# Patient Record
Sex: Female | Born: 1960 | Race: White | Hispanic: No | Smoking: Current some day smoker
Health system: Southern US, Community
[De-identification: ages and names within clinical notes are randomized; demographics above are authoritative.]

## PROBLEM LIST (undated history)

## (undated) DIAGNOSIS — N83209 Unspecified ovarian cyst, unspecified side: Secondary | ICD-10-CM

## (undated) DIAGNOSIS — I10 Essential (primary) hypertension: Secondary | ICD-10-CM

---

## 2005-09-25 ENCOUNTER — Emergency Department (HOSPITAL_COMMUNITY): Admission: EM | Admit: 2005-09-25 | Discharge: 2005-09-25 | Payer: Self-pay | Admitting: Family Medicine

## 2005-10-24 ENCOUNTER — Emergency Department (HOSPITAL_COMMUNITY): Admission: EM | Admit: 2005-10-24 | Discharge: 2005-10-24 | Payer: Self-pay | Admitting: Emergency Medicine

## 2005-11-17 ENCOUNTER — Encounter: Admission: RE | Admit: 2005-11-17 | Discharge: 2005-11-17 | Payer: Self-pay | Admitting: Internal Medicine

## 2005-12-19 ENCOUNTER — Emergency Department (HOSPITAL_COMMUNITY): Admission: EM | Admit: 2005-12-19 | Discharge: 2005-12-19 | Payer: Self-pay | Admitting: Family Medicine

## 2006-08-05 ENCOUNTER — Emergency Department (HOSPITAL_COMMUNITY): Admission: EM | Admit: 2006-08-05 | Discharge: 2006-08-05 | Payer: Self-pay | Admitting: Family Medicine

## 2006-08-30 ENCOUNTER — Emergency Department (HOSPITAL_COMMUNITY): Admission: EM | Admit: 2006-08-30 | Discharge: 2006-08-30 | Payer: Self-pay | Admitting: Emergency Medicine

## 2006-12-19 ENCOUNTER — Emergency Department (HOSPITAL_COMMUNITY): Admission: EM | Admit: 2006-12-19 | Discharge: 2006-12-19 | Payer: Self-pay | Admitting: Emergency Medicine

## 2009-04-24 ENCOUNTER — Emergency Department (HOSPITAL_COMMUNITY): Admission: EM | Admit: 2009-04-24 | Discharge: 2009-04-24 | Payer: Self-pay | Admitting: Emergency Medicine

## 2009-06-03 ENCOUNTER — Ambulatory Visit (HOSPITAL_COMMUNITY): Admission: RE | Admit: 2009-06-03 | Discharge: 2009-06-03 | Payer: Self-pay | Admitting: Obstetrics & Gynecology

## 2009-08-01 ENCOUNTER — Emergency Department (HOSPITAL_COMMUNITY): Admission: EM | Admit: 2009-08-01 | Discharge: 2009-08-01 | Payer: Self-pay | Admitting: Emergency Medicine

## 2009-11-06 ENCOUNTER — Encounter: Admission: RE | Admit: 2009-11-06 | Discharge: 2009-11-06 | Payer: Self-pay | Admitting: Nephrology

## 2009-11-21 ENCOUNTER — Encounter: Admission: RE | Admit: 2009-11-21 | Discharge: 2009-11-21 | Payer: Self-pay | Admitting: Neurosurgery

## 2010-03-30 ENCOUNTER — Encounter: Payer: Self-pay | Admitting: Neurosurgery

## 2010-05-26 LAB — GC/CHLAMYDIA PROBE AMP, GENITAL: Chlamydia, DNA Probe: NEGATIVE

## 2010-05-26 LAB — URINALYSIS, ROUTINE W REFLEX MICROSCOPIC
Bilirubin Urine: NEGATIVE
Glucose, UA: NEGATIVE mg/dL
Hgb urine dipstick: NEGATIVE
Protein, ur: NEGATIVE mg/dL
Specific Gravity, Urine: 1.026 (ref 1.005–1.030)
Urobilinogen, UA: 0.2 mg/dL (ref 0.0–1.0)

## 2010-05-26 LAB — CBC
HCT: 39.3 % (ref 36.0–46.0)
Hemoglobin: 13.5 g/dL (ref 12.0–15.0)
MCV: 87.8 fL (ref 78.0–100.0)
Platelets: 212 10*3/uL (ref 150–400)
RDW: 13 % (ref 11.5–15.5)
WBC: 10.1 10*3/uL (ref 4.0–10.5)

## 2010-05-26 LAB — DIFFERENTIAL
Basophils Relative: 1 % (ref 0–1)
Eosinophils Absolute: 0.1 10*3/uL (ref 0.0–0.7)
Lymphocytes Relative: 29 % (ref 12–46)
Lymphs Abs: 3 10*3/uL (ref 0.7–4.0)
Monocytes Absolute: 0.5 10*3/uL (ref 0.1–1.0)

## 2010-05-26 LAB — WET PREP, GENITAL: Trich, Wet Prep: NONE SEEN

## 2010-05-29 LAB — CBC
HCT: 38.7 % (ref 36.0–46.0)
Hemoglobin: 13.3 g/dL (ref 12.0–15.0)
MCHC: 34.3 g/dL (ref 30.0–36.0)
MCV: 88.8 fL (ref 78.0–100.0)
Platelets: 206 10*3/uL (ref 150–400)
RBC: 4.36 MIL/uL (ref 3.87–5.11)
RDW: 13.5 % (ref 11.5–15.5)

## 2010-05-29 LAB — BASIC METABOLIC PANEL
CO2: 29 mEq/L (ref 19–32)
Chloride: 103 mEq/L (ref 96–112)
GFR calc Af Amer: >60 mL/min (ref >60)
Glucose, Bld: 123 mg/dL — ABNORMAL HIGH (ref 70–99)
Potassium: 4 mEq/L (ref 3.5–5.1)
Sodium: 139 mEq/L (ref 135–145)

## 2010-05-29 LAB — DIFFERENTIAL
Basophils Absolute: 0.1 10*3/uL (ref 0.0–0.1)
Basophils Relative: 1 % (ref 0–1)
Eosinophils Absolute: 0.1 10*3/uL (ref 0.0–0.7)
Eosinophils Relative: 1 % (ref 0–5)
Lymphs Abs: 2.4 10*3/uL (ref 0.7–4.0)
Neutro Abs: 7.7 10*3/uL (ref 1.7–7.7)

## 2010-05-29 LAB — URINALYSIS, ROUTINE W REFLEX MICROSCOPIC
Glucose, UA: NEGATIVE mg/dL
Ketones, ur: NEGATIVE mg/dL
Protein, ur: NEGATIVE mg/dL
Urobilinogen, UA: 0.2 mg/dL (ref 0.0–1.0)

## 2010-05-29 LAB — POCT PREGNANCY, URINE: Preg Test, Ur: NEGATIVE

## 2010-06-06 ENCOUNTER — Emergency Department (HOSPITAL_COMMUNITY)
Admission: EM | Admit: 2010-06-06 | Discharge: 2010-06-06 | Disposition: A | Discharge status: Home / Self Care | Payer: Medicaid Other | Attending: Emergency Medicine | Admitting: Emergency Medicine

## 2010-06-06 DIAGNOSIS — J45909 Unspecified asthma, uncomplicated: Secondary | ICD-10-CM | POA: Insufficient documentation

## 2010-06-06 DIAGNOSIS — R51 Headache: Secondary | ICD-10-CM | POA: Insufficient documentation

## 2010-06-06 DIAGNOSIS — E119 Type 2 diabetes mellitus without complications: Secondary | ICD-10-CM | POA: Insufficient documentation

## 2010-06-06 DIAGNOSIS — I10 Essential (primary) hypertension: Secondary | ICD-10-CM | POA: Insufficient documentation

## 2010-06-06 DIAGNOSIS — Z79899 Other long term (current) drug therapy: Secondary | ICD-10-CM | POA: Insufficient documentation

## 2010-06-06 DIAGNOSIS — G8929 Other chronic pain: Secondary | ICD-10-CM | POA: Insufficient documentation

## 2010-06-06 DIAGNOSIS — M542 Cervicalgia: Secondary | ICD-10-CM | POA: Insufficient documentation

## 2010-07-31 ENCOUNTER — Other Ambulatory Visit: Payer: Self-pay | Admitting: Nephrology

## 2010-07-31 DIAGNOSIS — Z1231 Encounter for screening mammogram for malignant neoplasm of breast: Secondary | ICD-10-CM

## 2010-08-07 ENCOUNTER — Other Ambulatory Visit: Payer: Self-pay | Admitting: Nephrology

## 2010-08-07 DIAGNOSIS — IMO0002 Reserved for concepts with insufficient information to code with codable children: Secondary | ICD-10-CM

## 2010-08-07 DIAGNOSIS — M479 Spondylosis, unspecified: Secondary | ICD-10-CM

## 2010-08-07 DIAGNOSIS — M549 Dorsalgia, unspecified: Secondary | ICD-10-CM

## 2010-08-08 ENCOUNTER — Ambulatory Visit
Admission: RE | Admit: 2010-08-08 | Discharge: 2010-08-08 | Disposition: A | Discharge status: Home / Self Care | Payer: Medicaid Other | Source: Ambulatory Visit | Attending: Nephrology | Admitting: Nephrology

## 2010-08-08 DIAGNOSIS — Z1231 Encounter for screening mammogram for malignant neoplasm of breast: Secondary | ICD-10-CM

## 2010-08-19 ENCOUNTER — Other Ambulatory Visit: Payer: Medicaid Other

## 2010-09-01 ENCOUNTER — Other Ambulatory Visit (HOSPITAL_COMMUNITY): Payer: Medicaid Other

## 2010-09-11 ENCOUNTER — Other Ambulatory Visit (HOSPITAL_COMMUNITY): Payer: Medicaid Other

## 2010-09-12 ENCOUNTER — Other Ambulatory Visit (HOSPITAL_COMMUNITY): Payer: Medicaid Other

## 2010-09-12 ENCOUNTER — Inpatient Hospital Stay (HOSPITAL_COMMUNITY): Admission: RE | Admit: 2010-09-12 | Payer: Medicaid Other | Source: Ambulatory Visit

## 2011-02-13 IMAGING — US US PELVIS COMPLETE MODIFY
1 series · 13 of 25 positions shown · non-contrast
Comparison: CT on 04/24/2009

CLINICAL DATA: Abdominal pain.  Right adnexal cystic mass seen on
recent CT. Post menopausal female.

TRANSABDOMINAL AND TRANSVAGINAL ULTRASOUND OF PELVIS
TECHNIQUE: Both transabdominal and transvaginal ultrasound
examinations of the pelvis were performed including evaluation of
the uterus, ovaries, adnexal regions, and pelvic cul-de-sac.

[Series 1: us transvaginal non-ob · 13 of 57 slices shown]
[im 1/57]
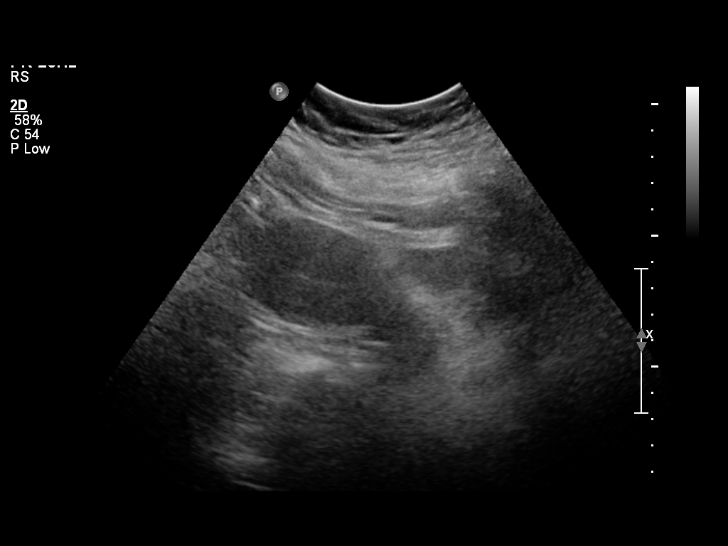
[im 5/57]
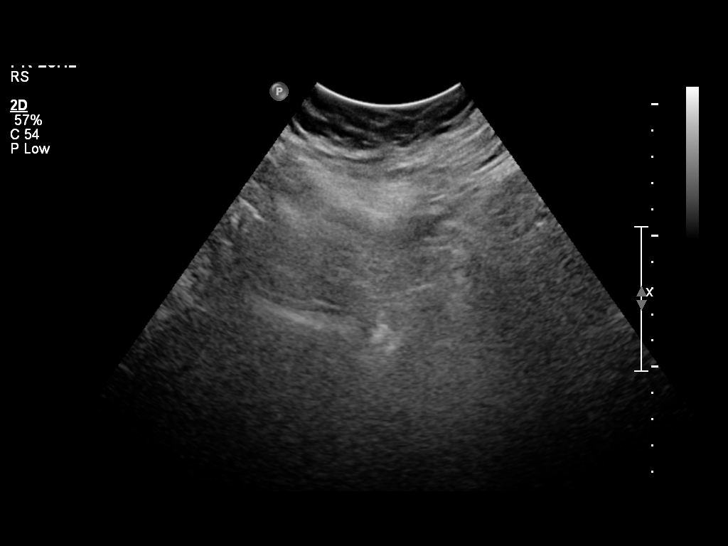
[im 10/57]
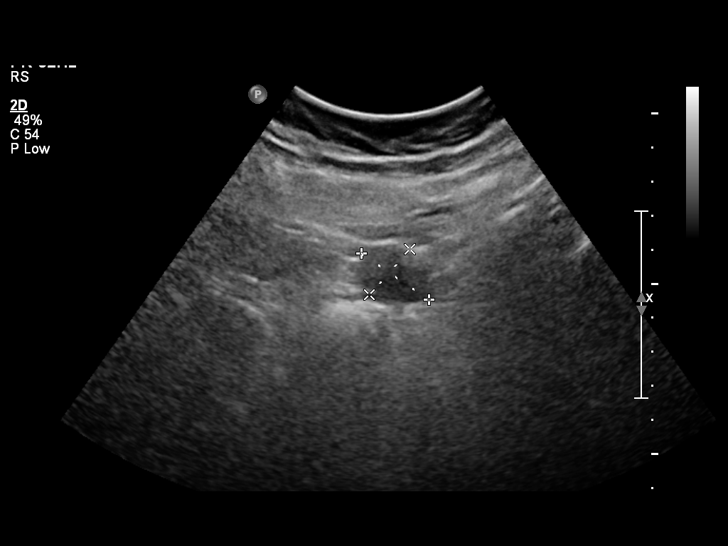
[im 15/57]
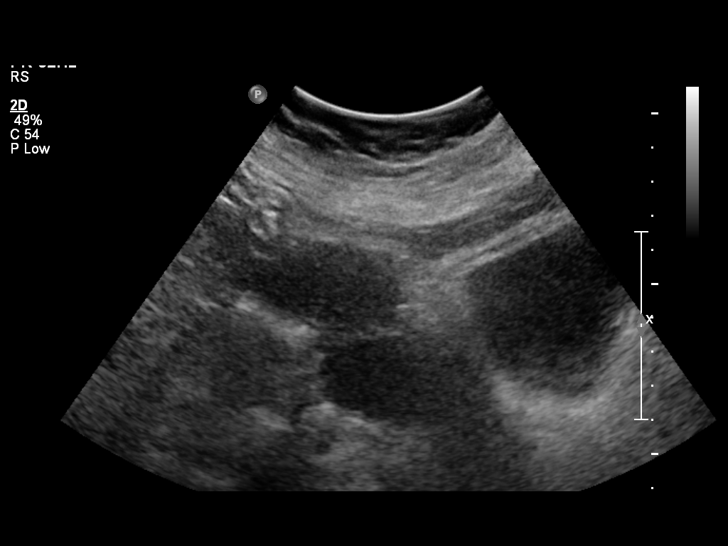
[im 19/57]
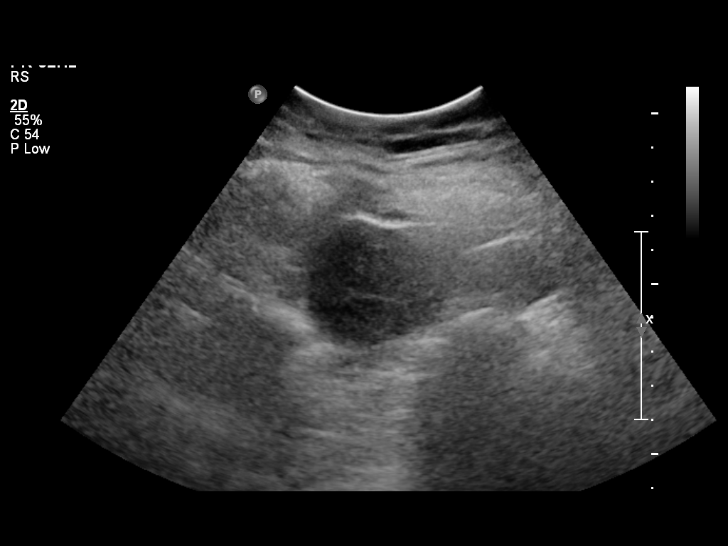
[im 24/57]
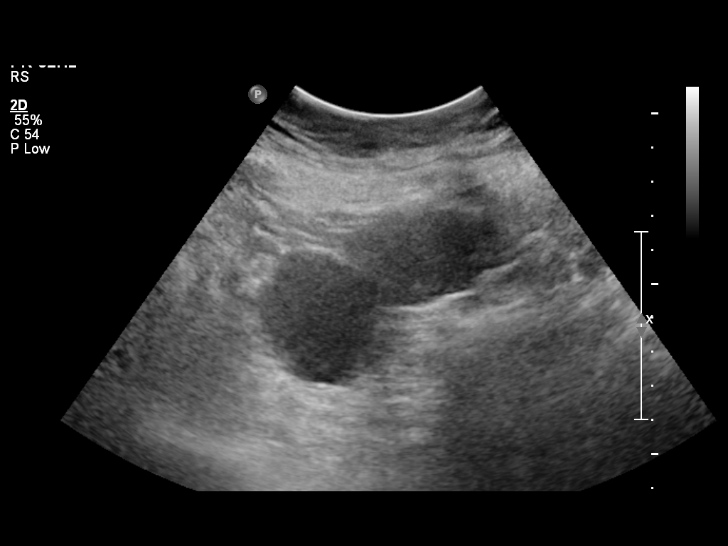
[im 29/57]
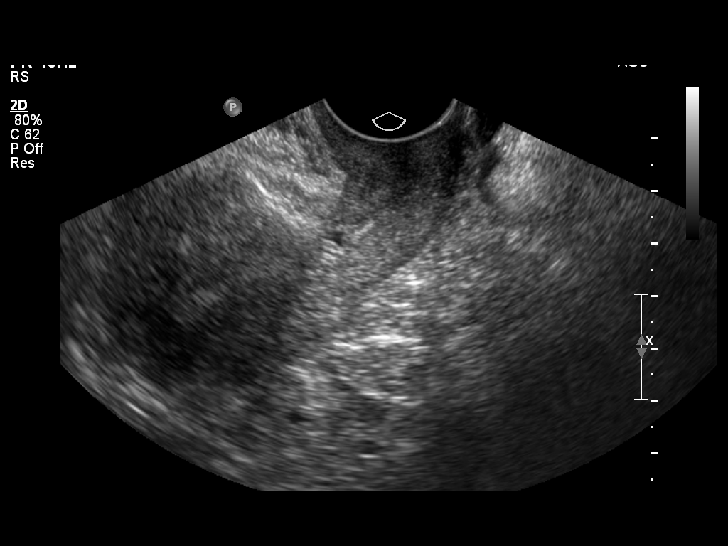
[im 33/57]
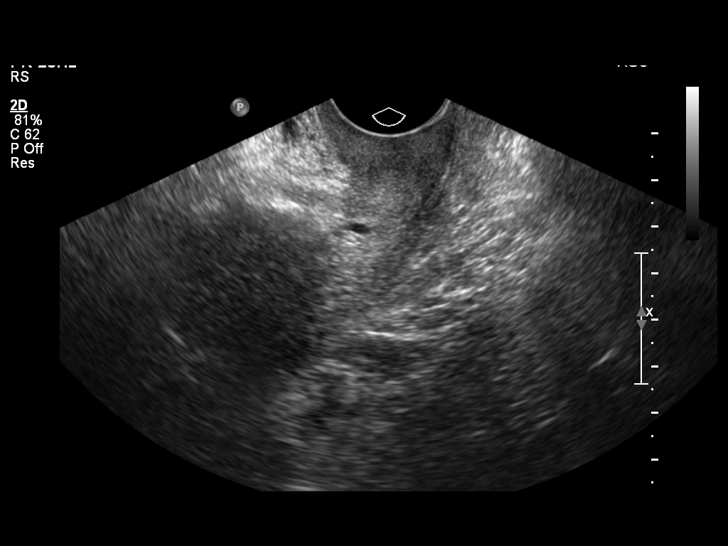
[im 38/57]
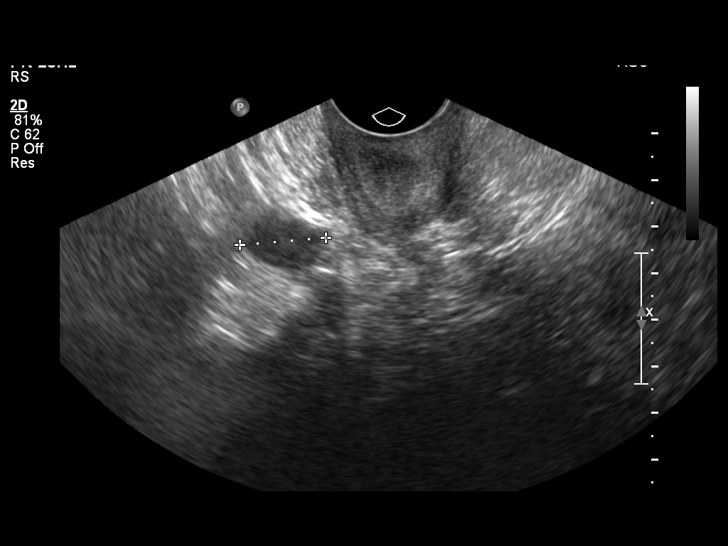
[im 43/57]
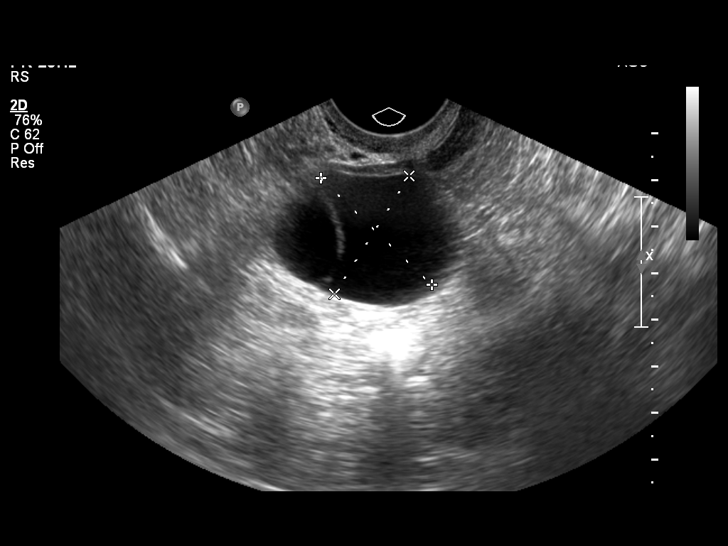
[im 47/57]
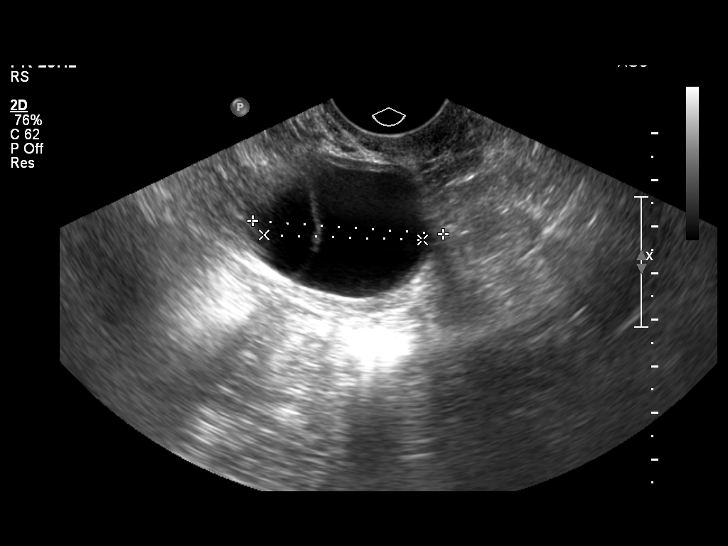
[im 52/57]
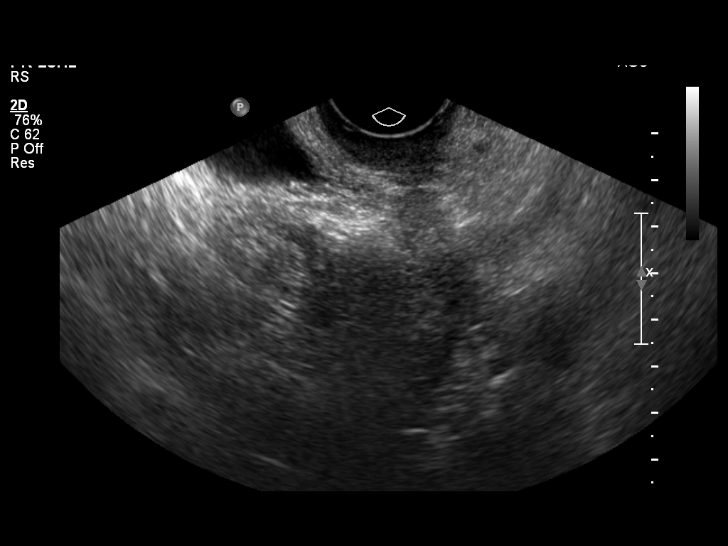
[im 57/57]
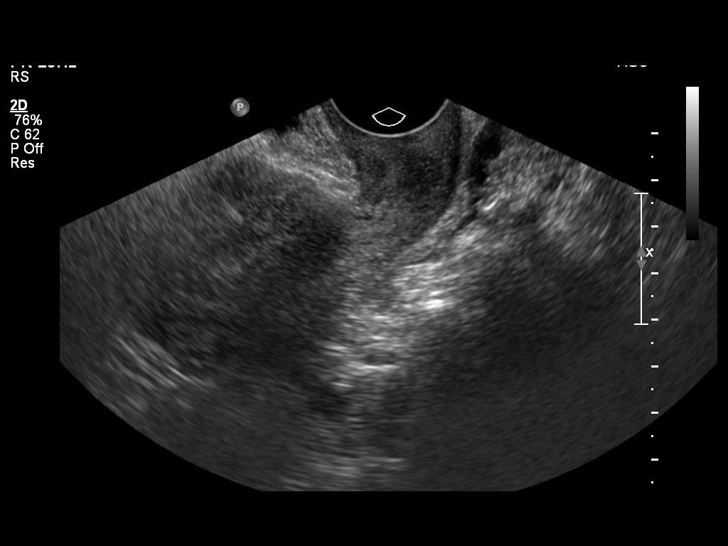

[13 of 25 positions shown; findings below may reference images not displayed]

FINDINGS: Uterus measures 8.7 x 4.0 x 5.0 cm. No fibroids or other uterine
masses identified.

Endometrium measures 5 mm in thickness.  Within normal limits in
appearance.

Right Ovary measures 4.1 x 3.5 x 4.1 cm. A cystic lesion is seen
containing a thin internal septation, but no evidence of soft
tissue nodularity or definite internal blood flow.  This lesion
measures 3.3 by 3.0 x 3.4 cm, which is a slightly smaller in size
than on previous CT when it measured 4.5 cm.

Left Ovary measures 2.1 x 1.4 x 1.9 cm.  Normal appearance.

Other Findings:  No adnexal mass or free fluid identified.
IMPRESSION: 1.  3.4 cm right ovarian cyst lesion with indeterminate, but
probably benign characteristics.  Annual follow-up by ultrasound is
recommended in a postmenopausal female.
2.  Normal uterus and left ovary.

This recommendation follows the consensus statement:  Management of
Asymptomatic Ovarian and Other Adnexal Cysts Imaged at US:  Society
of Radiologists in Ultrasound Consensus Conference Statement.
[URL]

## 2011-02-18 ENCOUNTER — Ambulatory Visit
Admission: RE | Admit: 2011-02-18 | Discharge: 2011-02-18 | Disposition: A | Discharge status: Home / Self Care | Payer: Medicaid Other | Source: Ambulatory Visit | Attending: Nephrology | Admitting: Nephrology

## 2011-02-18 ENCOUNTER — Other Ambulatory Visit: Payer: Self-pay | Admitting: Nephrology

## 2011-02-18 DIAGNOSIS — M25569 Pain in unspecified knee: Secondary | ICD-10-CM

## 2011-04-13 IMAGING — US US ART/VEN ABD/PELV/SCROTUM DOPPLER COMPLETE
1 series · 7 of 7 positions shown · non-contrast
Comparison: Ultrasound of the pelvis 08/01/2009 and 06/03/2009.

CLINICAL DATA: Right ovarian cyst.  Pelvic pain.

DOPPLER ULTRASOUND OF OVARIES
TECHNIQUE: Color and duplex Doppler ultrasound was utilized to
evaluate blood flow to the ovaries.

[Series 1: us art/ven abd/pelv/scrotum doppler complete · 0.13mm/px · 7 of 7 slices shown]
[im 1/7]
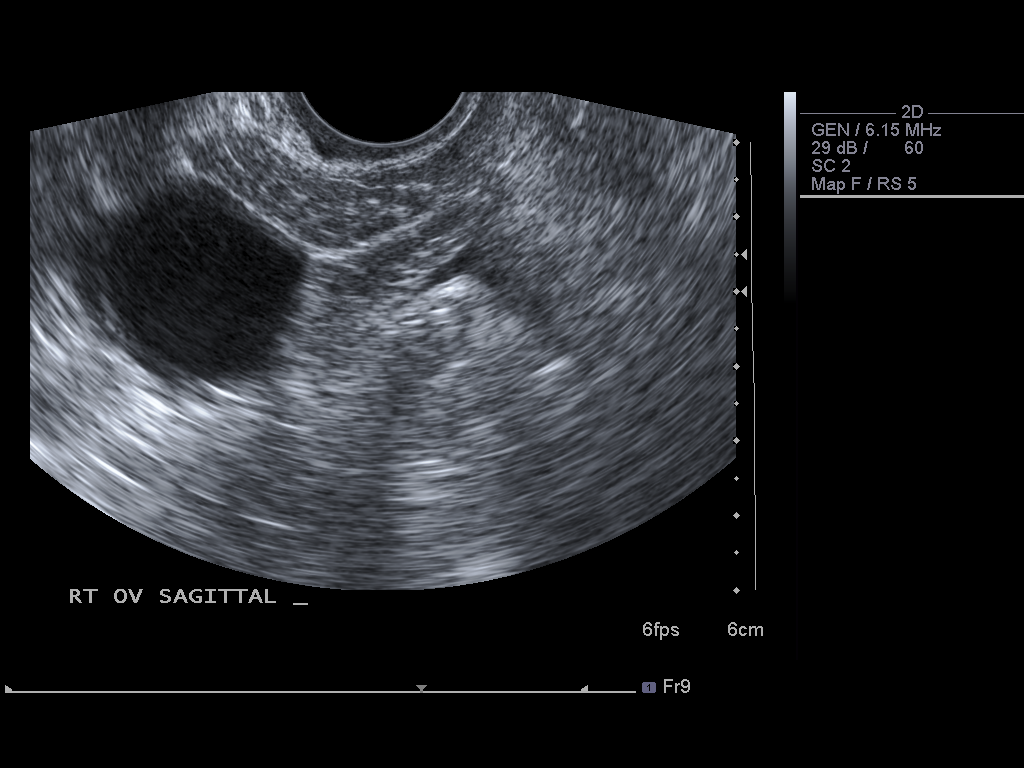
[im 2/7]
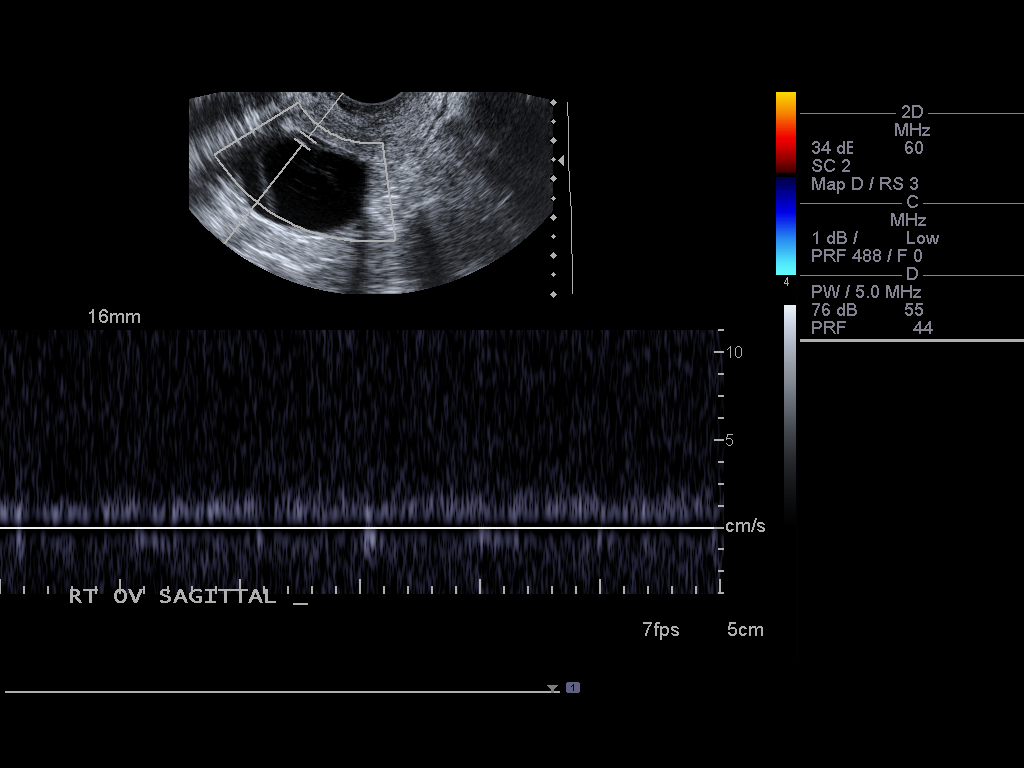
[im 3/7]
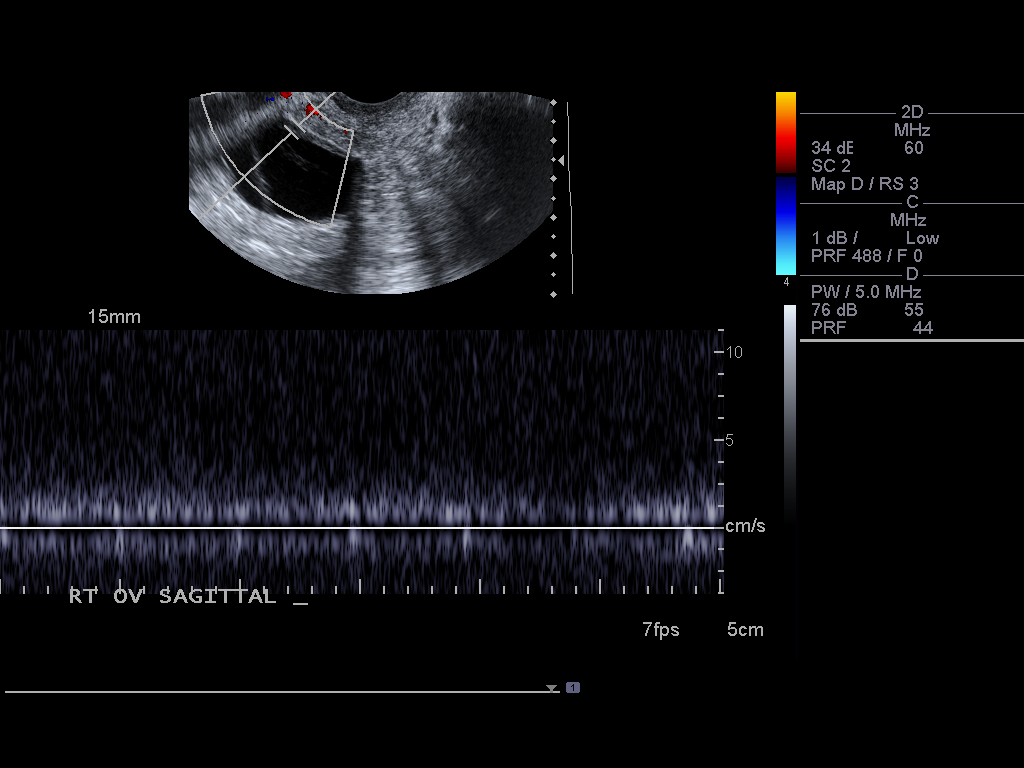
[im 4/7]
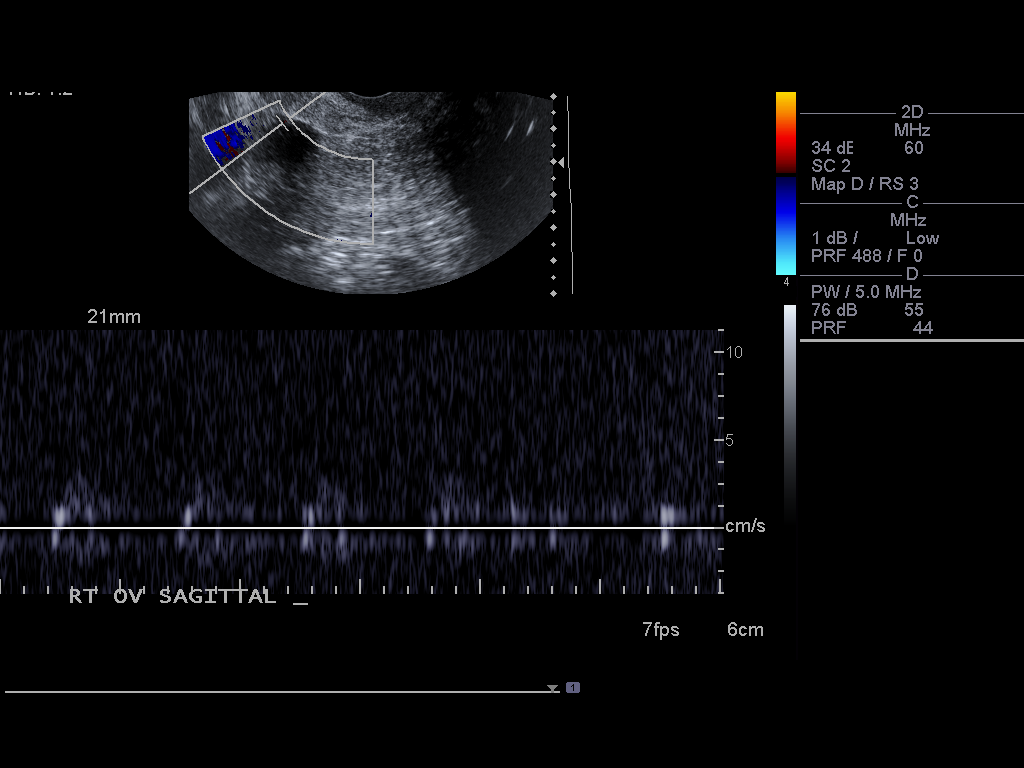
[im 5/7]
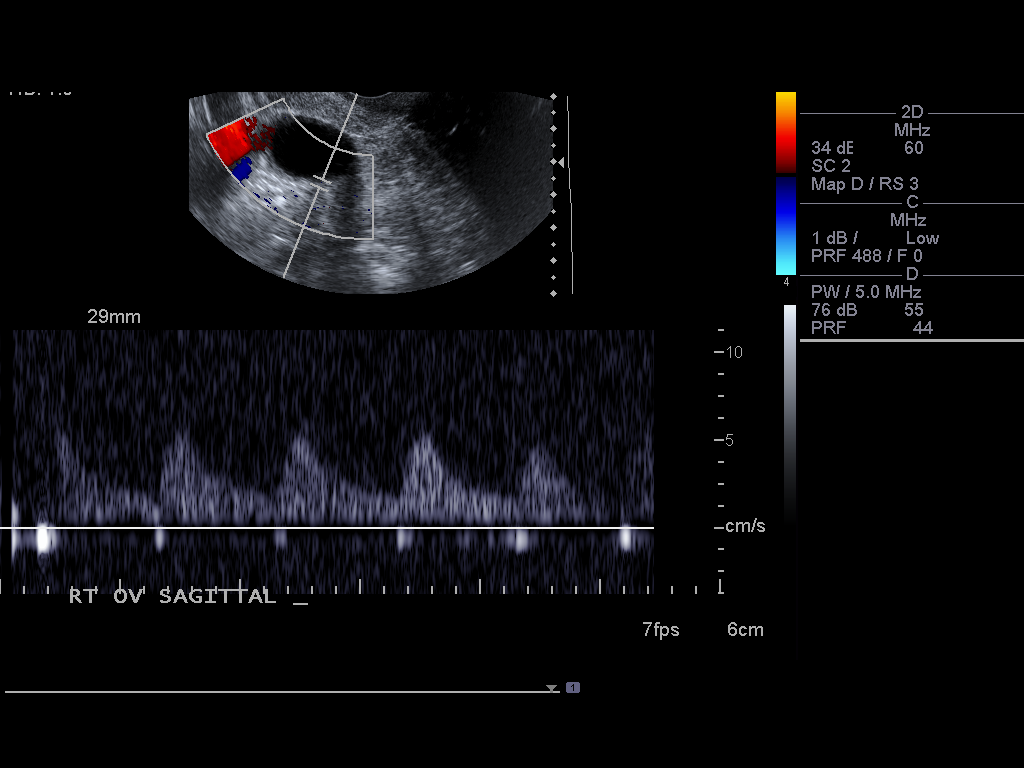
[im 6/7]
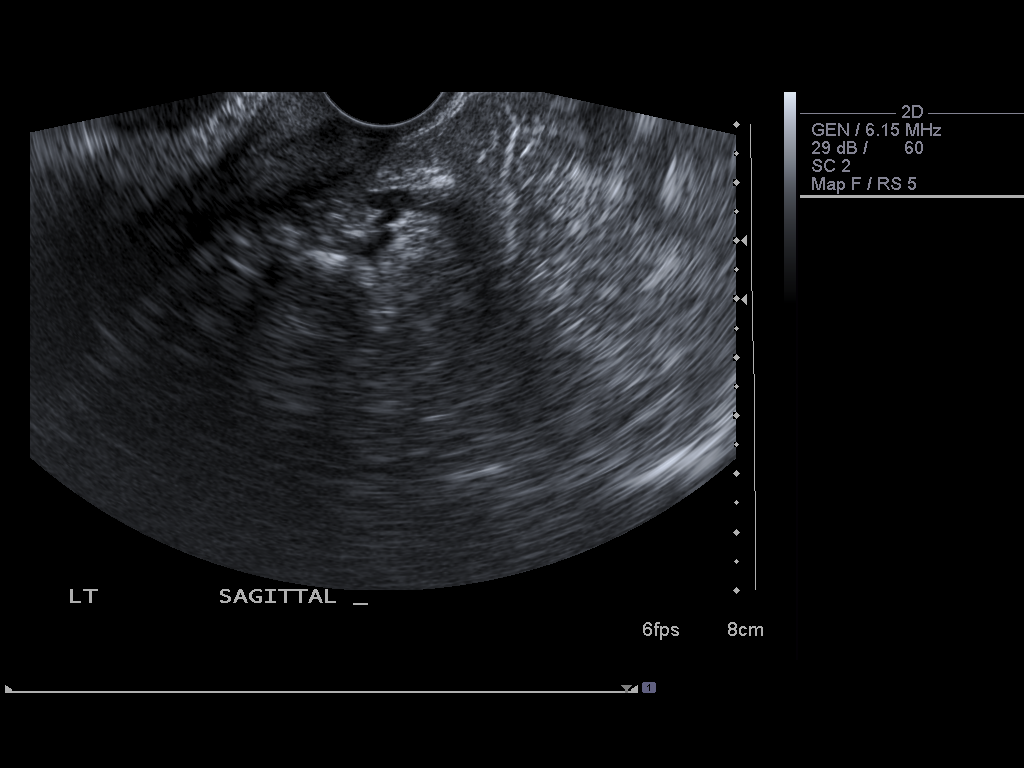
[im 7/7]
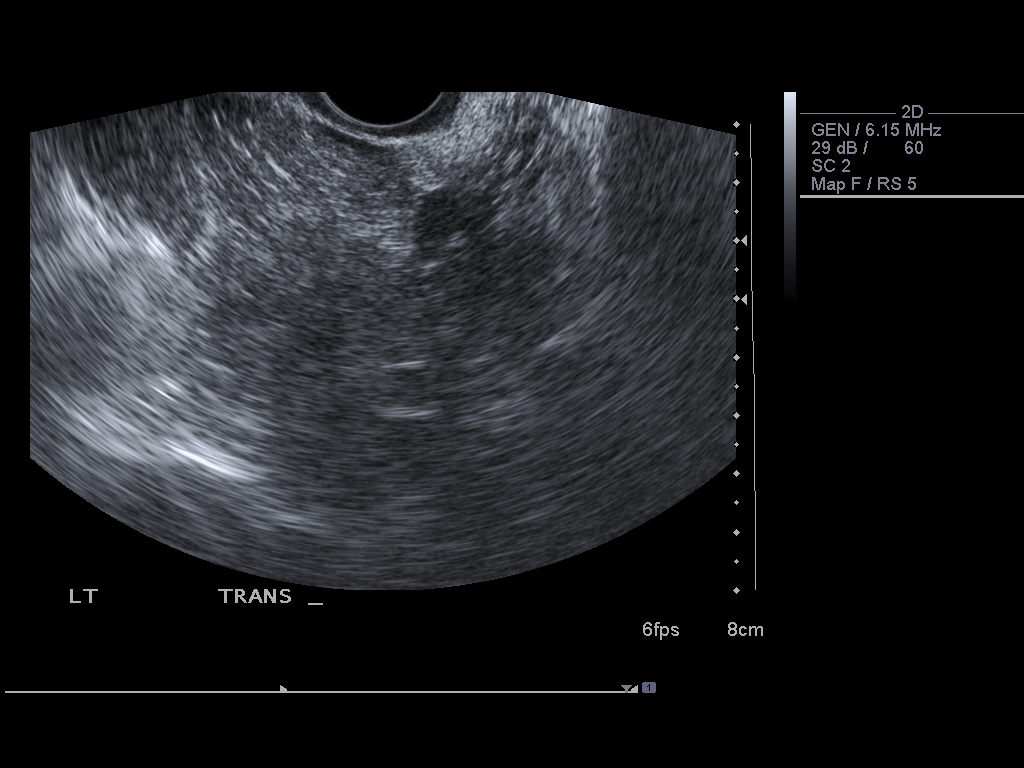

[7 of 7 positions shown; findings below may reference images not displayed]

FINDINGS: The patient returned for Doppler evaluation.  Along the
periphery of the right ovarian cystic structure, venous and
arterial flow are noted by waveform analysis.  Left adnexa not
visualized.
IMPRESSION: Along the periphery of the right ovarian cystic structure, venous
and arterial flow are noted by waveform analysis.

Left adnexa not visualized.

## 2012-10-30 IMAGING — CR DG KNEE 1-2V*L*
2 series · 2 of 2 positions shown · non-contrast
Comparison: None.

CLINICAL DATA: Pain without trauma

LEFT KNEE - 1-2 VIEW

[t knee ap left]
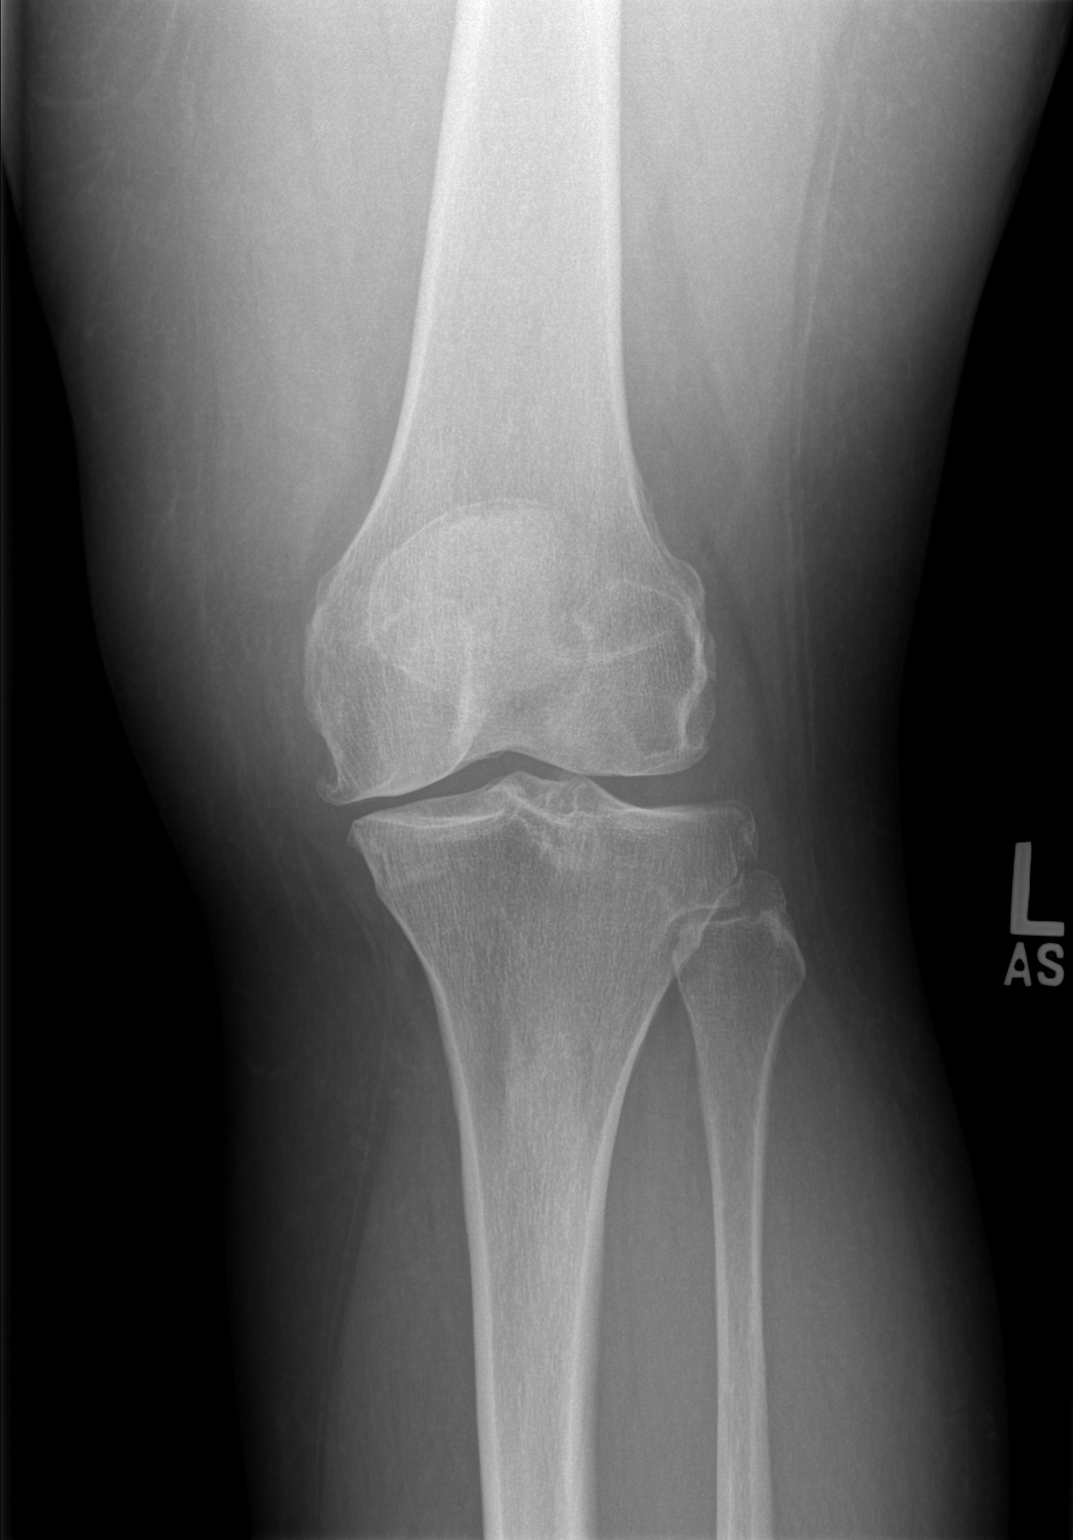

[t knee lat left]
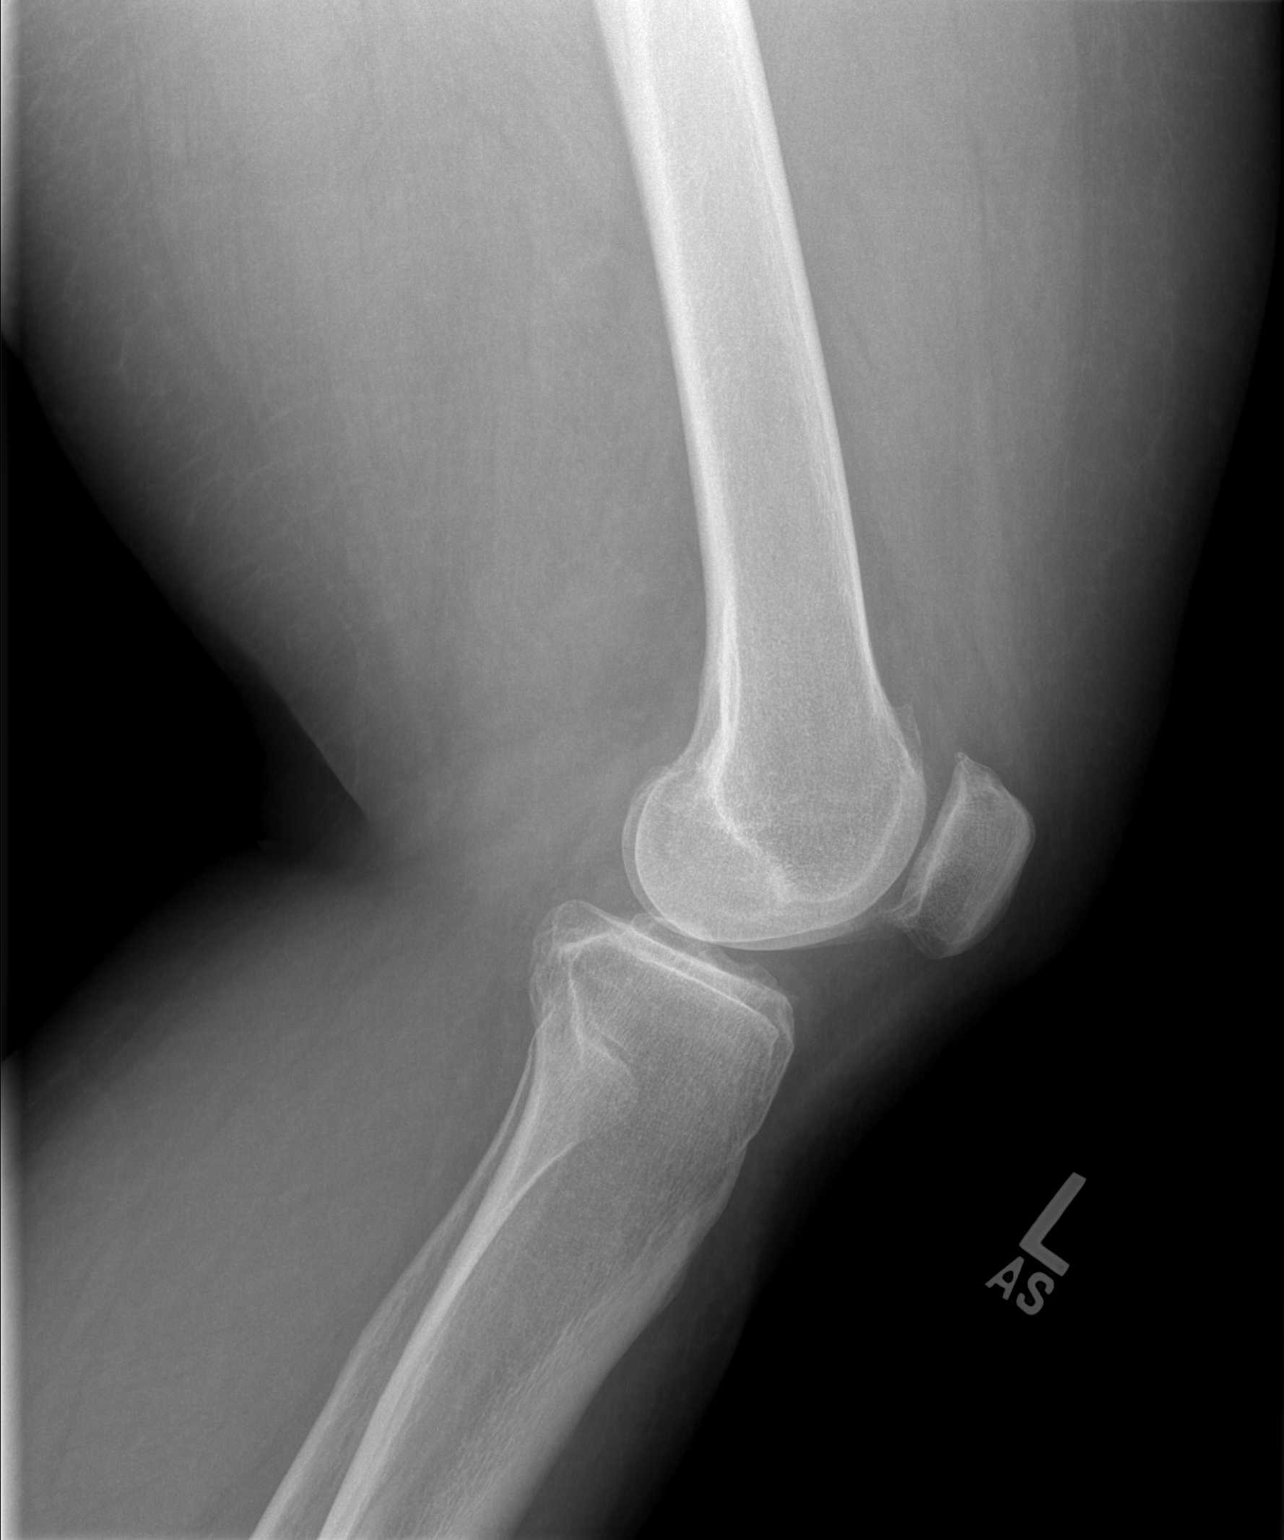

[2 of 2 positions shown; findings below may reference images not displayed]

FINDINGS: Narrowing of the articular cartilage in the medial
compartment.  Small marginal spurs from the articular surface of
the patella, medial femoral condyle, and medial tibial plateau.  No
definite effusion.  Negative for fracture, dislocation, or other
acute bony abnormality.
IMPRESSION: 1.  Negative for fracture or other acute bony abnormality.
2.  Medial and patellofemoral compartment degenerative changes as
above.

## 2013-03-11 ENCOUNTER — Encounter (HOSPITAL_COMMUNITY): Payer: Self-pay | Admitting: Emergency Medicine

## 2013-03-11 ENCOUNTER — Emergency Department (HOSPITAL_COMMUNITY)
Admission: EM | Admit: 2013-03-11 | Discharge: 2013-03-11 | Disposition: A | Discharge status: Home / Self Care | Payer: Medicaid Other | Attending: Emergency Medicine | Admitting: Emergency Medicine

## 2013-03-11 ENCOUNTER — Emergency Department (HOSPITAL_COMMUNITY): Payer: Medicaid Other

## 2013-03-11 DIAGNOSIS — I10 Essential (primary) hypertension: Secondary | ICD-10-CM | POA: Insufficient documentation

## 2013-03-11 DIAGNOSIS — N83299 Other ovarian cyst, unspecified side: Secondary | ICD-10-CM

## 2013-03-11 DIAGNOSIS — N83209 Unspecified ovarian cyst, unspecified side: Secondary | ICD-10-CM | POA: Insufficient documentation

## 2013-03-11 DIAGNOSIS — F17200 Nicotine dependence, unspecified, uncomplicated: Secondary | ICD-10-CM | POA: Insufficient documentation

## 2013-03-11 HISTORY — DX: Unspecified ovarian cyst, unspecified side: N83.209

## 2013-03-11 HISTORY — DX: Essential (primary) hypertension: I10

## 2013-03-11 LAB — BASIC METABOLIC PANEL
BUN: 14 mg/dL (ref 6–23)
CHLORIDE: 99 meq/L (ref 96–112)
CO2: 28 mEq/L (ref 19–32)
Calcium: 9.1 mg/dL (ref 8.4–10.5)
Creatinine, Ser: 0.57 mg/dL (ref 0.50–1.10)
GFR calc Af Amer: >90 mL/min (ref >90)
GFR calc non Af Amer: >90 mL/min (ref >90)
Glucose, Bld: 197 mg/dL — ABNORMAL HIGH (ref 70–99)
Potassium: 4 mEq/L (ref 3.7–5.3)
Sodium: 138 mEq/L (ref 137–147)

## 2013-03-11 LAB — CBC WITH DIFFERENTIAL/PLATELET
BASOS ABS: 0 10*3/uL (ref 0.0–0.1)
Basophils Relative: 0 % (ref 0–1)
Eosinophils Absolute: 0.2 10*3/uL (ref 0.0–0.7)
Eosinophils Relative: 2 % (ref 0–5)
HEMATOCRIT: 39.1 % (ref 36.0–46.0)
Hemoglobin: 13 g/dL (ref 12.0–15.0)
LYMPHS ABS: 2.1 10*3/uL (ref 0.7–4.0)
LYMPHS PCT: 18 % (ref 12–46)
MCH: 29.9 pg (ref 26.0–34.0)
MCHC: 33.2 g/dL (ref 30.0–36.0)
MCV: 89.9 fL (ref 78.0–100.0)
MONO ABS: 0.6 10*3/uL (ref 0.1–1.0)
Monocytes Relative: 5 % (ref 3–12)
NEUTROS ABS: 8.8 10*3/uL — AB (ref 1.7–7.7)
Neutrophils Relative %: 75 % (ref 43–77)
Platelets: 226 10*3/uL (ref 150–400)
RBC: 4.35 MIL/uL (ref 3.87–5.11)
RDW: 13.1 % (ref 11.5–15.5)
WBC: 11.8 10*3/uL — AB (ref 4.0–10.5)

## 2013-03-11 LAB — WET PREP, GENITAL
Clue Cells Wet Prep HPF POC: NONE SEEN
Trich, Wet Prep: NONE SEEN
WBC WET PREP: NONE SEEN
Yeast Wet Prep HPF POC: NONE SEEN

## 2013-03-11 LAB — URINALYSIS, ROUTINE W REFLEX MICROSCOPIC
BILIRUBIN URINE: NEGATIVE
Glucose, UA: NEGATIVE mg/dL
KETONES UR: NEGATIVE mg/dL
Leukocytes, UA: NEGATIVE
NITRITE: NEGATIVE
PH: 5.5 (ref 5.0–8.0)
PROTEIN: NEGATIVE mg/dL
Specific Gravity, Urine: 1.022 (ref 1.005–1.030)
UROBILINOGEN UA: 0.2 mg/dL (ref 0.0–1.0)

## 2013-03-11 LAB — URINE MICROSCOPIC-ADD ON

## 2013-03-11 MED ORDER — OXYCODONE-ACETAMINOPHEN 5-325 MG PO TABS: 2.0000 | ORAL_TABLET | ORAL | Status: AC | PRN

## 2013-03-11 MED ORDER — MORPHINE SULFATE 4 MG/ML IJ SOLN
4.0000 mg | Freq: Once | INTRAMUSCULAR | Status: AC
  Administered 2013-03-11: 4 mg via INTRAVENOUS
  Filled 2013-03-11: qty 1

## 2013-03-11 MED ORDER — OXYCODONE-ACETAMINOPHEN 5-325 MG PO TABS
2.0000 | ORAL_TABLET | Freq: Once | ORAL | Status: AC
  Administered 2013-03-11: 2 via ORAL
  Filled 2013-03-11: qty 2

## 2013-03-11 MED ORDER — ONDANSETRON HCL 4 MG/2ML IJ SOLN
4.0000 mg | Freq: Once | INTRAMUSCULAR | Status: AC
  Administered 2013-03-11: 4 mg via INTRAVENOUS
  Filled 2013-03-11: qty 2

## 2013-03-11 NOTE — ED Notes (Signed)
I gave the patient a warm blanket. 

## 2013-03-11 NOTE — ED Provider Notes (Signed)
CSN: [id number]     Arrival date & time 03/11/13  1025 History   First MD Initiated Contact with Patient 03/11/13 1111     Chief Complaint  Patient presents with  . Pelvic Pain   (Consider location/radiation/quality/duration/timing/severity/associated sxs/prior Treatment) HPI Comments: Pt states that she has odor to urine and maybe a discharge:pt states that the pain is similar to previous ovarian cyst 3 years ago  Patient is a 53 y.o. female presenting with pelvic pain. The history is provided by the patient. No language interpreter was used.  Pelvic Pain This is a recurrent problem. The current episode started more than 1 month ago. The problem occurs constantly. The problem has been gradually worsening. Associated symptoms include abdominal pain. Pertinent negatives include no fever or vomiting.    Past Medical History  Diagnosis Date  . Ovarian cyst   . Hypertension    Past Surgical History  Procedure Laterality Date  . Cesarean section     History reviewed. No pertinent family history. History  Substance Use Topics  . Smoking status: Current Some Day Smoker    Types: Cigarettes  . Smokeless tobacco: Not on file  . Alcohol Use: No   OB History   Grav Para Term Preterm Abortions TAB SAB Ect Mult Living                 Review of Systems  Constitutional: Negative for fever.  Respiratory: Negative.   Cardiovascular: Negative.   Gastrointestinal: Positive for abdominal pain. Negative for vomiting.  Genitourinary: Positive for pelvic pain.    Allergies  Meloxicam  Home Medications   Current Outpatient Rx  Name  Route  Sig  Dispense  Refill  . ibuprofen (ADVIL,MOTRIN) 200 MG tablet   Oral   Take 200-400 mg by mouth every 6 (six) hours as needed.          BP 142/65  Pulse 96  Temp(Src) 98.1 F (36.7 C) (Oral)  Resp 18  Wt 253 lb (114.76 kg)  SpO2 95% Physical Exam  Nursing note and vitals reviewed. Constitutional: She is oriented to person, place, and  time. She appears well-developed and well-nourished.  Cardiovascular: Normal rate and regular rhythm.   Pulmonary/Chest: Effort normal and breath sounds normal.  Abdominal: Soft. Bowel sounds are normal. There is no tenderness.  Genitourinary:  White vaginal discharge  Musculoskeletal: Normal range of motion.  Neurological: She is alert and oriented to person, place, and time.  Skin: Skin is warm and dry.  Psychiatric: She has a normal mood and affect.    ED Course  Procedures (including critical care time) Labs Review Labs Reviewed  URINALYSIS, ROUTINE W REFLEX MICROSCOPIC - Abnormal; Notable for the following:    APPearance CLOUDY (*)    Hgb urine dipstick SMALL (*)    All other components within normal limits  CBC WITH DIFFERENTIAL - Abnormal; Notable for the following:    WBC 11.8 (*)    Neutro Abs 8.8 (*)    All other components within normal limits  BASIC METABOLIC PANEL - Abnormal; Notable for the following:    Glucose, Bld 197 (*)    All other components within normal limits  WET PREP, GENITAL  GC/CHLAMYDIA PROBE AMP  URINE MICROSCOPIC-ADD ON   Imaging Review US Transvaginal Non-ob  03/11/2013   CLINICAL DATA:  Postmenopausal 53 year old patient right lower quadrant pain and history of ovarian cyst.  EXAM: TRANSABDOMINAL AND TRANSVAGINAL ULTRASOUND OF PELVIS  TECHNIQUE: Both transabdominal and transvaginal ultrasound examinations of  the pelvis were performed. Transabdominal technique was performed for global imaging of the pelvis including uterus, ovaries, adnexal regions, and pelvic cul-de-sac. It was necessary to proceed with endovaginal exam following the transabdominal exam to visualize the adnexa.  COMPARISON:  Pelvic ultrasound 08/01/2009  FINDINGS: Uterus  Measurements: 6.1 x 3.2 x 3.6 cm. Partially retroverted. No focal mass lesion is identified. No fibroids or other mass visualized.  Endometrium  Measures 4.4 mm maximum thickness. Image detail of the endometrium is  slightly limited, due to partial rows of retroversion of the uterus. No focal endometrial abnormality is identified.  Right ovary  Measurements: In the right adnexae is a mildly complex cyst that measures 5.0 x 3.7 x 4.0 cm. A few thin internal echogenicities/thin septations are seen within this cyst. The wall of the cyst is thin. No focal nodularity is seen. No internal vascular flow is identified. This may be this same mildly complex cyst that was seen in the right adnexa/right ovary in 2011. If so, it has enlarged since that time, previously measuring 2.7 x 2.8 x 2.2 cm. No discrete ovarian parenchyma is visualized.  Left ovary  Measurements: Not visualized.  No left adnexal mass is seen.  Other findings  No free fluid.  The sonographer reports of this is a technically difficult study due to the position of the uterus and patient pain.  IMPRESSION: Mildly complex right adnexal cyst measures 5.0 x 4.0 x 3.7 cm. This is likely the same cyst that was present in 2011, and has enlarged since that time. Benign or malignant ovarian neoplasm cannot be excluded. Given patient's postmenopausal status, followup gynecology consultation should be considered.   Electronically Signed   By: Britta MccreedySusan  Turner M.D.   On: 03/11/2013 13:46   [place] Pelvis Complete  03/11/2013   CLINICAL DATA:  Postmenopausal 53 year old patient right lower quadrant pain and history of ovarian cyst.  EXAM: TRANSABDOMINAL AND TRANSVAGINAL ULTRASOUND OF PELVIS  TECHNIQUE: Both transabdominal and transvaginal ultrasound examinations of the pelvis were performed. Transabdominal technique was performed for global imaging of the pelvis including uterus, ovaries, adnexal regions, and pelvic cul-de-sac. It was necessary to proceed with endovaginal exam following the transabdominal exam to visualize the adnexa.  COMPARISON:  Pelvic ultrasound 08/01/2009  FINDINGS: Uterus  Measurements: 6.1 x 3.2 x 3.6 cm. Partially retroverted. No focal mass lesion is identified.  No fibroids or other mass visualized.  Endometrium  Measures 4.4 mm maximum thickness. Image detail of the endometrium is slightly limited, due to partial rows of retroversion of the uterus. No focal endometrial abnormality is identified.  Right ovary  Measurements: In the right adnexae is a mildly complex cyst that measures 5.0 x 3.7 x 4.0 cm. A few thin internal echogenicities/thin septations are seen within this cyst. The wall of the cyst is thin. No focal nodularity is seen. No internal vascular flow is identified. This may be this same mildly complex cyst that was seen in the right adnexa/right ovary in 2011. If so, it has enlarged since that time, previously measuring 2.7 x 2.8 x 2.2 cm. No discrete ovarian parenchyma is visualized.  Left ovary  Measurements: Not visualized.  No left adnexal mass is seen.  Other findings  No free fluid.  The sonographer reports of this is a technically difficult study due to the position of the uterus and patient pain.  IMPRESSION: Mildly complex right adnexal cyst measures 5.0 x 4.0 x 3.7 cm. This is likely the same cyst that was present in 2011, and  has enlarged since that time. Benign or malignant ovarian neoplasm cannot be excluded. Given patient's postmenopausal status, followup gynecology consultation should be considered.   Electronically Signed   By: Britta Mccreedy M.D.   On: 03/11/2013 13:46    EKG Interpretation   None       MDM   1. Complex ovarian cyst    Will send home with something for pain:pt has gyn to follow up with:discussed the findings are concerning:pt verbalizes understanding    Teressa Lower, NP 03/11/13 1359

## 2013-03-11 NOTE — ED Provider Notes (Signed)
Medical screening examination/treatment/procedure(s) were performed by non-physician practitioner and as supervising physician I was immediately available for consultation/collaboration.  EKG Interpretation   None         Gwyneth SproutWhitney Dheeraj Hail, MD 03/11/13 1533

## 2013-03-11 NOTE — Discharge Instructions (Signed)
Make sure you follow up as discussed because the cyst has gotten bigger. Ovarian Cyst The ovaries are small organs that are on each side of the uterus. The ovaries are the organs that produce the female hormones, estrogen and progesterone. An ovarian cyst is a sac filled with fluid that can vary in its size. It is normal for a small cyst to form in women who are in the childbearing age and who have menstrual periods. This type of cyst is called a follicle cyst that becomes an ovulation cyst (corpus luteum cyst) after it produces the women's egg. It later goes away on its own if the woman does not become pregnant. There are other kinds of ovarian cysts that may cause problems and may need to be treated. The most serious problem is a cyst with cancer. It should be noted that menopausal women who have an ovarian cyst are at a higher risk of it being a cancer cyst. They should be evaluated very quickly, thoroughly and followed closely. This is especially true in menopausal women because of the high rate of ovarian cancer in women in menopause. CAUSES AND TYPES OF OVARIAN CYSTS:  FUNCTIONAL CYST: The follicle/corpus luteum cyst is a functional cyst that occurs every month during ovulation with the menstrual cycle. They go away with the next menstrual cycle if the woman does not get pregnant. Usually, there are no symptoms with a functional cyst.  ENDOMETRIOMA CYST: This cyst develops from the lining of the uterus tissue. This cyst gets in or on the ovary. It grows every month from the bleeding during the menstrual period. It is also called a "chocolate cyst" because it becomes filled with blood that turns brown. This cyst can cause pain in the lower abdomen during intercourse and with your menstrual period.  CYSTADENOMA CYST: This cyst develops from the cells on the outside of the ovary. They usually are not cancerous. They can get very big and cause lower abdomen pain and pain with intercourse. This type of  cyst can twist on itself, cut off its blood supply and cause severe pain. It also can easily rupture and cause a lot of pain.  DERMOID CYST: This type of cyst is sometimes found in both ovaries. They are found to have different kinds of body tissue in the cyst. The tissue includes skin, teeth, hair, and/or cartilage. They usually do not have symptoms unless they get very big. Dermoid cysts are rarely cancerous.  POLYCYSTIC OVARY: This is a rare condition with hormone problems that produces many small cysts on both ovaries. The cysts are follicle-like cysts that never produce an egg and become a corpus luteum. It can cause an increase in body weight, infertility, acne, increase in body and facial hair and lack of menstrual periods or rare menstrual periods. Many women with this problem develop type 2 diabetes. The exact cause of this problem is unknown. A polycystic ovary is rarely cancerous.  THECA LUTEIN CYST: Occurs when too much hormone (human chorionic gonadotropin) is produced and over-stimulates the ovaries to produce an egg. They are frequently seen when doctors stimulate the ovaries for invitro-fertilization (test tube babies).  LUTEOMA CYST: This cyst is seen during pregnancy. Rarely it can cause an obstruction to the birth canal during labor and delivery. They usually go away after delivery. SYMPTOMS   Pelvic pain or pressure.  Pain during sexual intercourse.  Increasing girth (swelling) of the abdomen.  Abnormal menstrual periods.  Increasing pain with menstrual periods.  You stop  having menstrual periods and you are not pregnant. DIAGNOSIS  The diagnosis can be made during:  Routine or annual pelvic examination (common).  Ultrasound.  X-ray of the pelvis.  CT Scan.  MRI.  Blood tests. TREATMENT   Treatment may only be to follow the cyst monthly for 2 to 3 months with your caregiver. Many go away on their own, especially functional cysts.  May be aspirated (drained)  with a long needle with ultrasound, or by laparoscopy (inserting a tube into the pelvis through a small incision).  The whole cyst can be removed by laparoscopy.  Sometimes the cyst may need to be removed through an incision in the lower abdomen.  Hormone treatment is sometimes used to help dissolve certain cysts.  Birth control pills are sometimes used to help dissolve certain cysts. HOME CARE INSTRUCTIONS  Follow your caregiver's advice regarding:  Medicine.  Follow up visits to evaluate and treat the cyst.  You may need to come back or make an appointment with another caregiver, to find the exact cause of your cyst, if your caregiver is not a gynecologist.  Get your yearly and recommended pelvic examinations and Pap tests.  Let your caregiver know if you have had an ovarian cyst in the past. SEEK MEDICAL CARE IF:   Your periods are late, irregular, they stop, or are painful.  Your stomach (abdomen) or pelvic pain does not go away.  Your stomach becomes larger or swollen.  You have pressure on your bladder or trouble emptying your bladder completely.  You have painful sexual intercourse.  You have feelings of fullness, pressure, or discomfort in your stomach.  You lose weight for no apparent reason.  You feel generally ill.  You become constipated.  You lose your appetite.  You develop acne.  You have an increase in body and facial hair.  You are gaining weight, without changing your exercise and eating habits.  You think you are pregnant. SEEK IMMEDIATE MEDICAL CARE IF:   You have increasing abdominal pain.  You feel sick to your stomach (nausea) and/or vomit.  You develop a fever that comes on suddenly.  You develop abdominal pain during a bowel movement.  Your menstrual periods become heavier than usual. Document Released: 02/23/2005 Document Revised: 05/18/2011 Document Reviewed: 12/27/2008 [facility] Patient Information 2014 [place], [place].

## 2013-03-11 NOTE — ED Notes (Signed)
Pt c/o pelvic pain for past 2 weeks, feels like when she had ovarian cysts several years ago. Has tried several OTC meds with minimal relief of pain. States pain is worse with movement. Denies any bleeding or discharge.

## 2013-03-13 LAB — GC/CHLAMYDIA PROBE AMP
CT Probe RNA: NEGATIVE
GC Probe RNA: NEGATIVE

## 2014-11-21 IMAGING — US US PELVIS COMPLETE
1 series · 13 of 25 positions shown · non-contrast
Comparison: Pelvic ultrasound 08/01/2009

CLINICAL DATA: Postmenopausal 52-year-old patient right lower
quadrant pain and history of ovarian cyst.



[Series 1: us pelvis complete · 0.17mm/px · 13 of 27 slices shown]
[im 1/27]
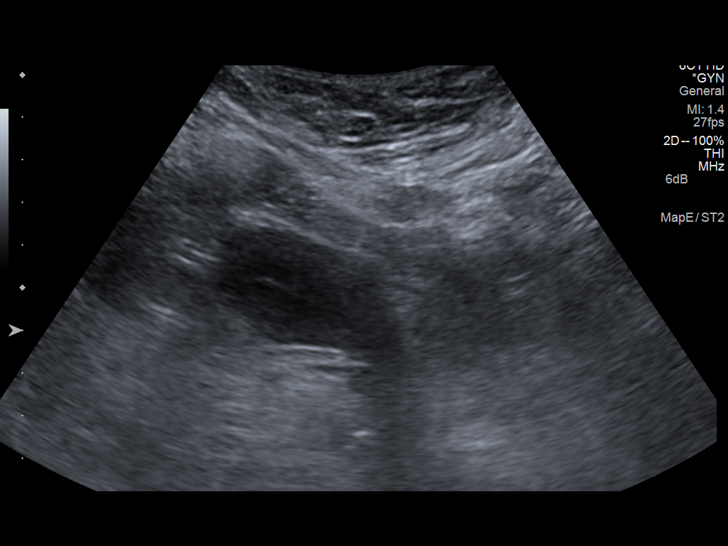
[im 3/27]
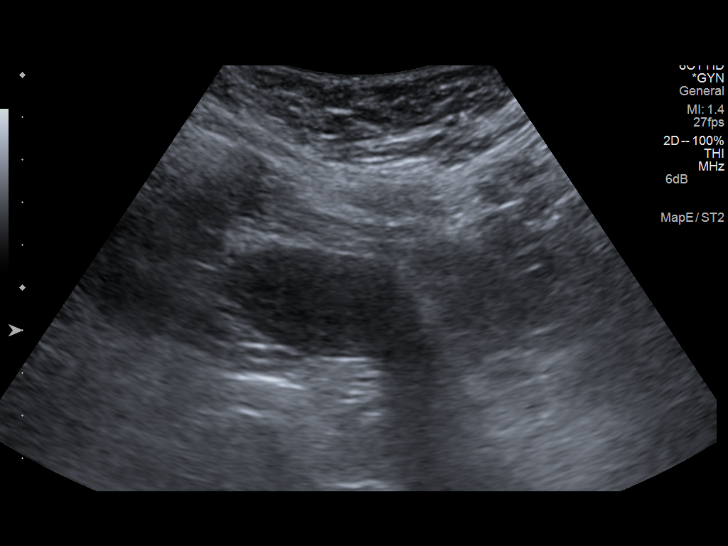
[im 5/27]
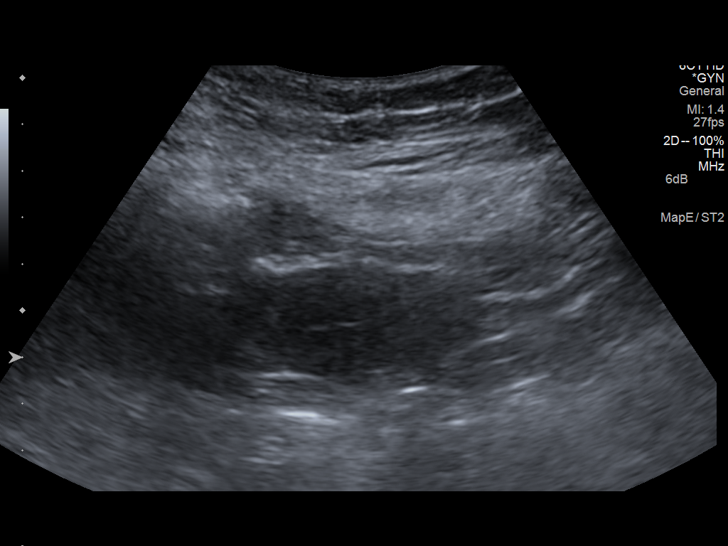
[im 7/27]
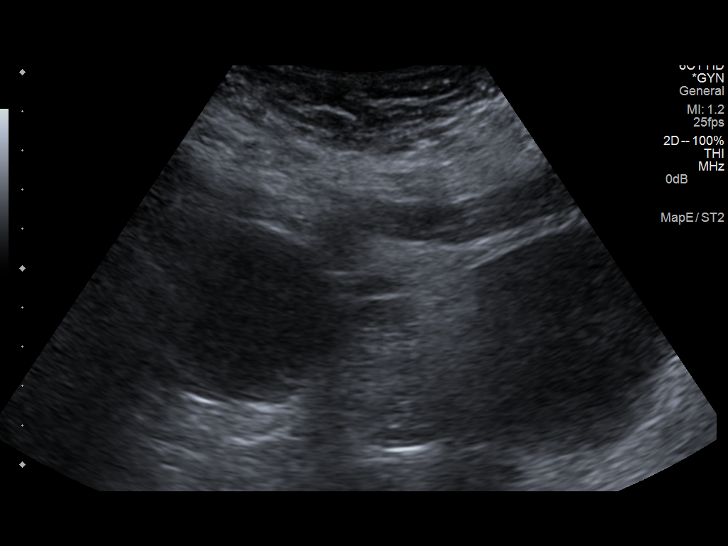
[im 9/27]
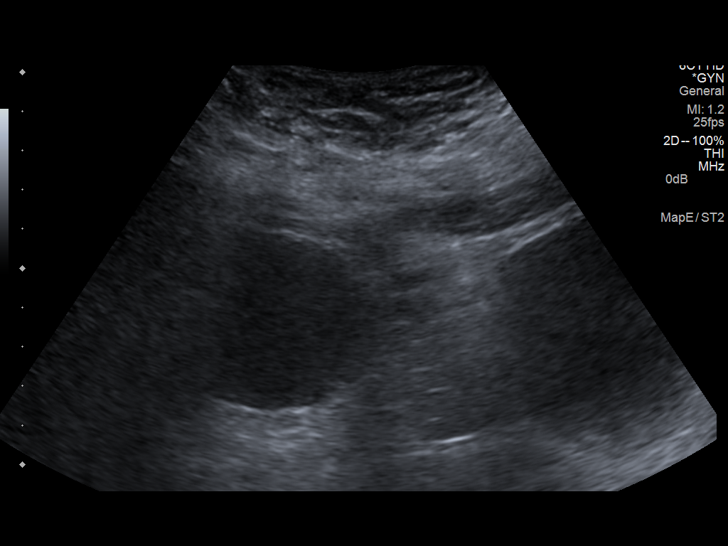
[im 11/27]
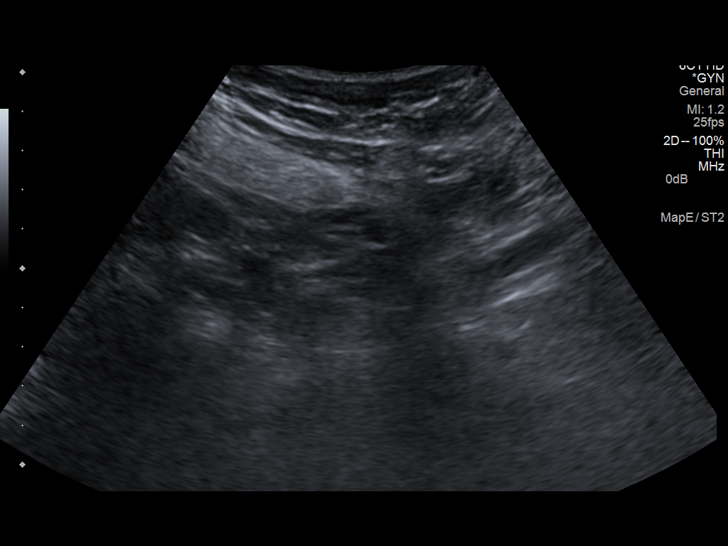
[im 14/27]
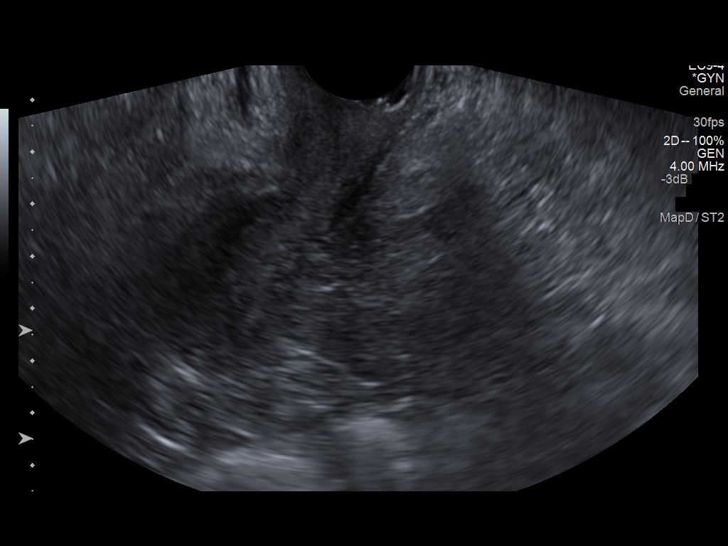
[im 16/27]
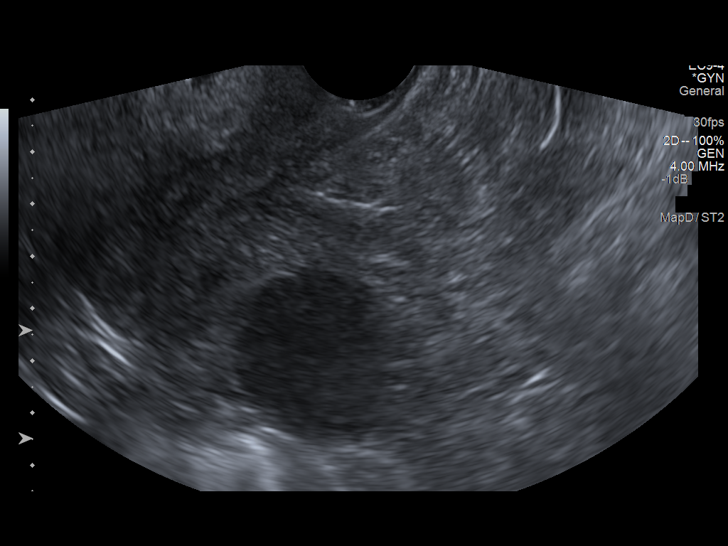
[im 18/27]
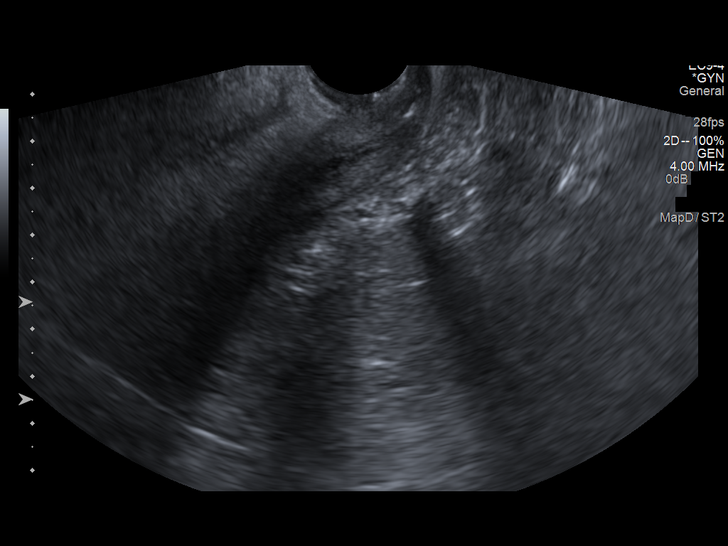
[im 20/27]
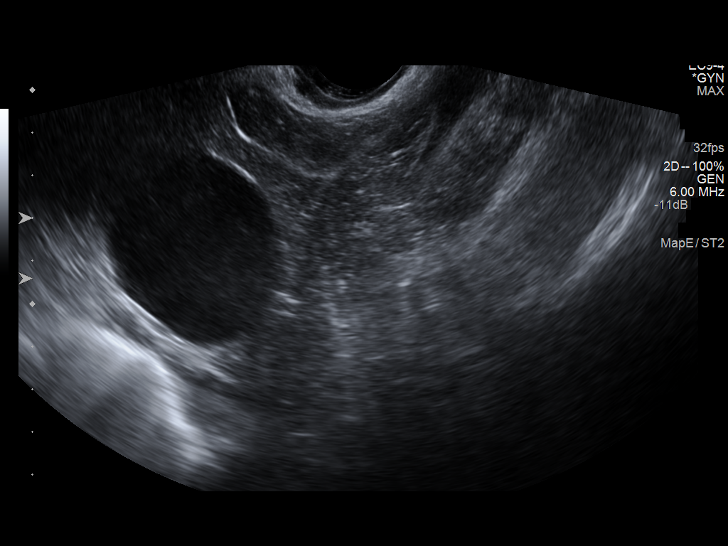
[im 22/27]
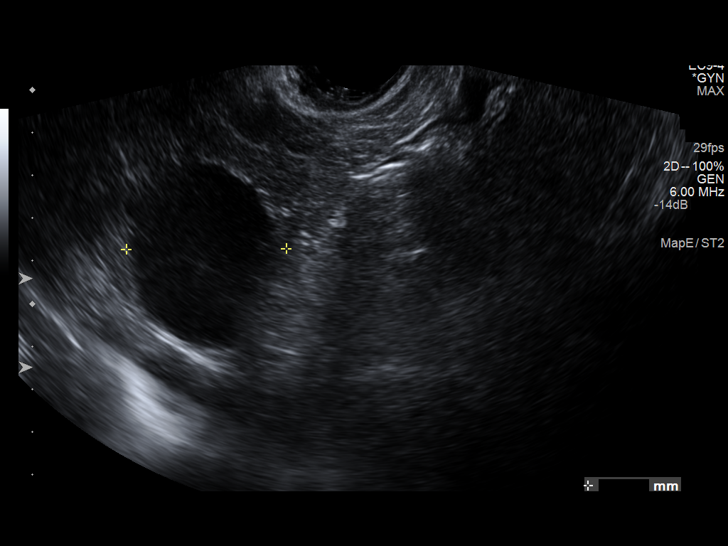
[im 24/27]
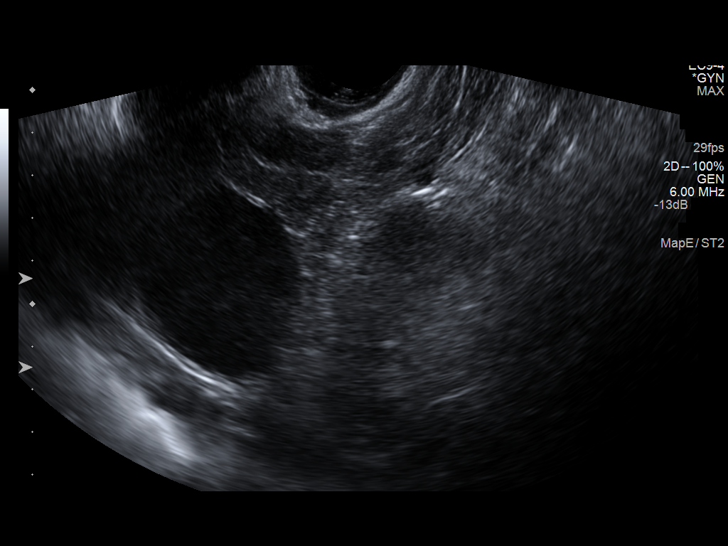
[im 27/27]
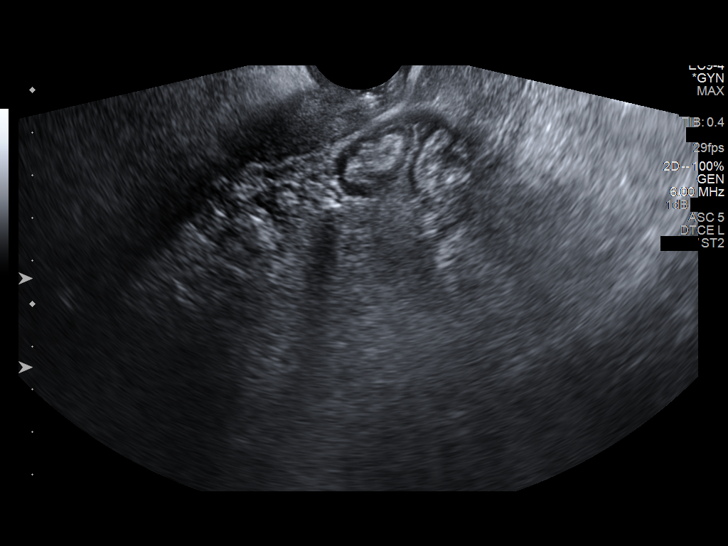

[13 of 25 positions shown; findings below may reference images not displayed]

FINDINGS: Uterus

Measurements: 6.1 x 3.2 x 3.6 cm. Partially retroverted. No focal
mass lesion is identified.. No fibroids or other mass visualized.

Endometrium

Measures 4.4 mm maximum thickness. Image detail of the endometrium
is slightly limited, due to partial rows of retroversion of the
uterus. No focal endometrial abnormality is identified.

Right ovary

Measurements: In the right adnexae is a mildly complex cyst that
measures 5.0 x 3.7 x 4.0 cm. A few thin internal echogenicities/thin
septations are seen within this cyst. The wall of the cyst is thin.
No focal nodularity is seen. No internal vascular flow is
identified. This may be this same mildly complex cyst that was seen
in the right adnexa/right ovary in 4655. If so, it has enlarged
since that time, previously measuring 2.7 x 2.8 x 2.2 cm. No
discrete ovarian parenchyma is visualized.

Left ovary

Measurements: Not visualized.  No left adnexal mass is seen.

Other findings

No free fluid.

The sonographer reports of this is a technically difficult study due
to the position of the uterus and patient pain.
IMPRESSION: Mildly complex right adnexal cyst measures 5.0 x 4.0 x 3.7 cm. This
is likely the same cyst that was present in 4655, and has enlarged
since that time. Benign or malignant ovarian neoplasm cannot be
excluded. Given patient's postmenopausal status, followup gynecology
consultation should be considered.
